# Patient Record
Sex: Male | Born: 1985 | Race: White | Hispanic: Yes | Marital: Single | Smoking: Never smoker
Health system: Southern US, Community
[De-identification: ages and names within clinical notes are randomized; demographics above are authoritative.]

## PROBLEM LIST (undated history)

## (undated) DIAGNOSIS — K5190 Ulcerative colitis, unspecified, without complications: Secondary | ICD-10-CM

---

## 2008-11-25 ENCOUNTER — Emergency Department (HOSPITAL_COMMUNITY): Admission: EM | Admit: 2008-11-25 | Discharge: 2008-11-25 | Payer: Self-pay | Admitting: Emergency Medicine

## 2009-01-05 ENCOUNTER — Emergency Department (HOSPITAL_COMMUNITY): Admission: EM | Admit: 2009-01-05 | Discharge: 2009-01-05 | Payer: Self-pay | Admitting: Emergency Medicine

## 2011-02-11 LAB — URINALYSIS, ROUTINE W REFLEX MICROSCOPIC
Bilirubin Urine: NEGATIVE
Nitrite: NEGATIVE
Protein, ur: NEGATIVE mg/dL
Specific Gravity, Urine: 1.016 (ref 1.005–1.030)
Urobilinogen, UA: 0.2 mg/dL (ref 0.0–1.0)

## 2011-02-15 LAB — D-DIMER, QUANTITATIVE (NOT AT ARMC): D-Dimer, Quant: <0.22 ug/mL-FEU (ref 0.00–0.48)

## 2013-12-05 ENCOUNTER — Emergency Department (HOSPITAL_COMMUNITY)
Admission: EM | Admit: 2013-12-05 | Discharge: 2013-12-05 | Disposition: A | Discharge status: Home / Self Care | Payer: Self-pay | Attending: Emergency Medicine | Admitting: Emergency Medicine

## 2013-12-05 ENCOUNTER — Encounter (HOSPITAL_COMMUNITY): Payer: Self-pay | Admitting: Emergency Medicine

## 2013-12-05 ENCOUNTER — Emergency Department (HOSPITAL_COMMUNITY): Payer: Self-pay

## 2013-12-05 DIAGNOSIS — R42 Dizziness and giddiness: Secondary | ICD-10-CM | POA: Insufficient documentation

## 2013-12-05 DIAGNOSIS — R51 Headache: Secondary | ICD-10-CM | POA: Insufficient documentation

## 2013-12-05 DIAGNOSIS — Z7982 Long term (current) use of aspirin: Secondary | ICD-10-CM | POA: Insufficient documentation

## 2013-12-05 DIAGNOSIS — R519 Headache, unspecified: Secondary | ICD-10-CM

## 2013-12-05 MED ORDER — SODIUM CHLORIDE 0.9 % IV BOLUS (SEPSIS)
1000.0000 mL | Freq: Once | INTRAVENOUS | Status: AC
  Administered 2013-12-05: 1000 mL via INTRAVENOUS

## 2013-12-05 MED ORDER — METOCLOPRAMIDE HCL 5 MG/ML IJ SOLN
10.0000 mg | Freq: Once | INTRAMUSCULAR | Status: AC
  Administered 2013-12-05: 10 mg via INTRAVENOUS
  Filled 2013-12-05: qty 2

## 2013-12-05 MED ORDER — DEXAMETHASONE SODIUM PHOSPHATE 10 MG/ML IJ SOLN
10.0000 mg | Freq: Once | INTRAMUSCULAR | Status: AC
  Administered 2013-12-05: 10 mg via INTRAVENOUS
  Filled 2013-12-05: qty 1

## 2013-12-05 MED ORDER — DIPHENHYDRAMINE HCL 50 MG/ML IJ SOLN
25.0000 mg | Freq: Once | INTRAMUSCULAR | Status: AC
  Administered 2013-12-05: 25 mg via INTRAVENOUS
  Filled 2013-12-05: qty 1

## 2013-12-05 NOTE — Progress Notes (Signed)
[facility] provided pt with a list of primary care resources and ACA information.  °

## 2013-12-05 NOTE — ED Notes (Signed)
Per pt, states he has had headache and dizziness for 2 days-took aspirin and it has not worked

## 2013-12-05 NOTE — Discharge Instructions (Signed)
Headaches, Frequently Asked Questions °MIGRAINE HEADACHES °Q: What is migraine? What causes it? How can I treat it? °A: Generally, migraine headaches begin as a dull ache. Then they develop into a constant, throbbing, and pulsating pain. You may experience pain at the temples. You may experience pain at the front or back of one or both sides of the head. The pain is usually accompanied by a combination of: °· Nausea. °· Vomiting. °· Sensitivity to light and noise. °Some people (about 15%) experience an aura (see below) before an attack. The cause of migraine is believed to be chemical reactions in the brain. Treatment for migraine may include over-the-counter or prescription medications. It may also include self-help techniques. These include relaxation training and biofeedback.  °Q: What is an aura? °A: About 15% of people with migraine get an "aura". This is a sign of neurological symptoms that occur before a migraine headache. You may see wavy or jagged lines, dots, or flashing lights. You might experience tunnel vision or blind spots in one or both eyes. The aura can include visual or auditory hallucinations (something imagined). It may include disruptions in smell (such as strange odors), taste or touch. Other symptoms include: °· Numbness. °· A "pins and needles" sensation. °· Difficulty in recalling or speaking the correct word. °These neurological events may last as long as 60 minutes. These symptoms will fade as the headache begins. °Q: What is a trigger? °A: Certain physical or environmental factors can lead to or "trigger" a migraine. These include: °· Foods. °· Hormonal changes. °· Weather. °· Stress. °It is important to remember that triggers are different for everyone. To help prevent migraine attacks, you need to figure out which triggers affect you. Keep a headache diary. This is a good way to track triggers. The diary will help you talk to your healthcare professional about your condition. °Q: Does  weather affect migraines? °A: Bright sunshine, hot, humid conditions, and drastic changes in barometric pressure may lead to, or "trigger," a migraine attack in some people. But studies have shown that weather does not act as a trigger for everyone with migraines. °Q: What is the link between migraine and hormones? °A: Hormones start and regulate many of your body's functions. Hormones keep your body in balance within a constantly changing environment. The levels of hormones in your body are unbalanced at times. Examples are during menstruation, pregnancy, or menopause. That can lead to a migraine attack. In fact, about three quarters of all women with migraine report that their attacks are related to the menstrual cycle.  °Q: Is there an increased risk of stroke for migraine sufferers? °A: The likelihood of a migraine attack causing a stroke is very remote. That is not to say that migraine sufferers cannot have a stroke associated with their migraines. In persons under age 40, the most common associated factor for stroke is migraine headache. But over the course of a person's normal life span, the occurrence of migraine headache may actually be associated with a reduced risk of dying from cerebrovascular disease due to stroke.  °Q: What are acute medications for migraine? °A: Acute medications are used to treat the pain of the headache after it has started. Examples over-the-counter medications, NSAIDs, ergots, and triptans.  °Q: What are the triptans? °A: Triptans are the newest class of abortive medications. They are specifically targeted to treat migraine. Triptans are vasoconstrictors. They moderate some chemical reactions in the brain. The triptans work on receptors in your brain. Triptans help   to restore the balance of a neurotransmitter called serotonin. Fluctuations in levels of serotonin are thought to be a main cause of migraine.  °Q: Are over-the-counter medications for migraine effective? °A:  Over-the-counter, or "OTC," medications may be effective in relieving mild to moderate pain and associated symptoms of migraine. But you should see your caregiver before beginning any treatment regimen for migraine.  °Q: What are preventive medications for migraine? °A: Preventive medications for migraine are sometimes referred to as "prophylactic" treatments. They are used to reduce the frequency, severity, and length of migraine attacks. Examples of preventive medications include antiepileptic medications, antidepressants, beta-blockers, calcium channel blockers, and NSAIDs (nonsteroidal anti-inflammatory drugs). °Q: Why are anticonvulsants used to treat migraine? °A: During the past few years, there has been an increased interest in antiepileptic drugs for the prevention of migraine. They are sometimes referred to as "anticonvulsants". Both epilepsy and migraine may be caused by similar reactions in the brain.  °Q: Why are antidepressants used to treat migraine? °A: Antidepressants are typically used to treat people with depression. They may reduce migraine frequency by regulating chemical levels, such as serotonin, in the brain.  °Q: What alternative therapies are used to treat migraine? °A: The term "alternative therapies" is often used to describe treatments considered outside the scope of conventional Western medicine. Examples of alternative therapy include acupuncture, acupressure, and yoga. Another common alternative treatment is herbal therapy. Some herbs are believed to relieve headache pain. Always discuss alternative therapies with your caregiver before proceeding. Some herbal products contain arsenic and other toxins. °TENSION HEADACHES °Q: What is a tension-type headache? What causes it? How can I treat it? °A: Tension-type headaches occur randomly. They are often the result of temporary stress, anxiety, fatigue, or anger. Symptoms include soreness in your temples, a tightening band-like sensation  around your head (a "vice-like" ache). Symptoms can also include a pulling feeling, pressure sensations, and contracting head and neck muscles. The headache begins in your forehead, temples, or the back of your head and neck. Treatment for tension-type headache may include over-the-counter or prescription medications. Treatment may also include self-help techniques such as relaxation training and biofeedback. °CLUSTER HEADACHES °Q: What is a cluster headache? What causes it? How can I treat it? °A: Cluster headache gets its name because the attacks come in groups. The pain arrives with little, if any, warning. It is usually on one side of the head. A tearing or bloodshot eye and a runny nose on the same side of the headache may also accompany the pain. Cluster headaches are believed to be caused by chemical reactions in the brain. They have been described as the most severe and intense of any headache type. Treatment for cluster headache includes prescription medication and oxygen. °SINUS HEADACHES °Q: What is a sinus headache? What causes it? How can I treat it? °A: When a cavity in the bones of the face and skull (a sinus) becomes inflamed, the inflammation will cause localized pain. This condition is usually the result of an allergic reaction, a tumor, or an infection. If your headache is caused by a sinus blockage, such as an infection, you will probably have a fever. An x-ray will confirm a sinus blockage. Your caregiver's treatment might include antibiotics for the infection, as well as antihistamines or decongestants.  °REBOUND HEADACHES °Q: What is a rebound headache? What causes it? How can I treat it? °A: A pattern of taking acute headache medications too often can lead to a condition known as "rebound headache."   A pattern of taking too much headache medication includes taking it more than 2 days per week or in excessive amounts. That means more than the label or a caregiver advises. With rebound  headaches, your medications not only stop relieving pain, they actually begin to cause headaches. Doctors treat rebound headache by tapering the medication that is being overused. Sometimes your caregiver will gradually substitute a different type of treatment or medication. Stopping may be a challenge. Regularly overusing a medication increases the potential for serious side effects. Consult a caregiver if you regularly use headache medications more than 2 days per week or more than the label advises. °ADDITIONAL QUESTIONS AND ANSWERS °Q: What is biofeedback? °A: Biofeedback is a self-help treatment. Biofeedback uses special equipment to monitor your body's involuntary physical responses. Biofeedback monitors: °· Breathing. °· Pulse. °· Heart rate. °· Temperature. °· Muscle tension. °· Brain activity. °Biofeedback helps you refine and perfect your relaxation exercises. You learn to control the physical responses that are related to stress. Once the technique has been mastered, you do not need the equipment any more. °Q: Are headaches hereditary? °A: Four out of five (80%) of people that suffer report a family history of migraine. Scientists are not sure if this is genetic or a family predisposition. Despite the uncertainty, a child has a 50% chance of having migraine if one parent suffers. The child has a 75% chance if both parents suffer.  °Q: Can children get headaches? °A: By the time they reach high school, most young people have experienced some type of headache. Many safe and effective approaches or medications can prevent a headache from occurring or stop it after it has begun.  °Q: What type of doctor should I see to diagnose and treat my headache? °A: Start with your primary caregiver. Discuss his or her experience and approach to headaches. Discuss methods of classification, diagnosis, and treatment. Your caregiver may decide to recommend you to a headache specialist, depending upon your symptoms or other  physical conditions. Having diabetes, allergies, etc., may require a more comprehensive and inclusive approach to your headache. The National Headache Foundation will provide, upon request, a list of NHF physician members in your state. °Document Released: 01/08/2004 Document Revised: 01/10/2012 Document Reviewed: 06/17/2008 °ExitCare® Patient Information ©2014 [employer]. ° ° °Emergency Department Resource Guide °1) Find a Doctor and Pay Out of Pocket °Although you won't have to find out who is covered by your insurance plan, it is a good idea to ask around and get recommendations. You will then need to call the office and see if the doctor you have chosen will accept you as a new patient and what types of options they offer for patients who are self-pay. Some doctors offer discounts or will set up payment plans for their patients who do not have insurance, but you will need to ask so you aren't surprised when you get to your appointment. ° °2) Contact Your Local Health Department °Not all health departments have doctors that can see patients for sick visits, but many do, so it is worth a call to see if yours does. If you don't know where your local health department is, you can check in your phone book. The CDC also has a tool to help you locate your state's health department, and many state websites also have listings of all of their local health departments. ° °3) Find a Walk-in Clinic °If your illness is not likely to be very severe or complicated, you may   want to try a walk in clinic. These are popping up all over the country in pharmacies, drugstores, and shopping centers. They're usually staffed by nurse practitioners or physician assistants that have been trained to treat common illnesses and complaints. They're usually fairly quick and inexpensive. However, if you have serious medical issues or chronic medical problems, these are probably not your best option. ° °No Primary Care Doctor: °- Call Health  Connect at  832-8000 - they can help you locate a primary care doctor that  accepts your insurance, provides certain services, etc. °- Physician Referral Service- 1-800-533-3463 ° °Chronic Pain Problems: °Organization         Address  Phone   Notes  °[facility]  (336) 297-2271 Patients need to be referred by their primary care doctor.  ° °Medication Assistance: °Organization         Address  Phone   Notes  °[place] [facility] [address] °[place], [place] [address] (336) 641-8030 --Must be a resident of [place] °-- Must have NO insurance coverage whatsoever (no Medicaid/ Medicare, etc.) °-- The pt. MUST have a primary care doctor that directs their care regularly and follows them in the community °  °[facility]  (866) 331-1348   °[employer]  (888) 892-1162   ° °Agencies that provide inexpensive medical care: °Organization         Address  Phone   Notes  °[facility]  (336) 832-8035   °[facility]    (336) 832-7272   °[facility] [address] °[place], [place] [address] (336) 832-4777   °[facility] [address], °[place] (336) 271-4999   °[facility]    (336) 373-0678   °[facility]    (336) 272-1050   °[facility] ° [address], [place] Phone:  (336) 832-4444, Fax:  (336) 832-4440 Hours of Operation:  9 am - 6 pm, M-F.  Also accepts Medicaid/Medicare and self-pay.  °[facility] ° [address], [place] Phone: (336) [phone], Fax: (336) [phone]. Hours of Operation:  8:30 am - 5:30 pm, M-F.  Also accepts Medicaid and self-pay.  °[facility] [place] [address], [place] Phone: (336) 878-6027   °[facility] [address], [place],  (336)723-1848, Ext. 123 Mondays & Thursdays: 7-9 AM.  First 15 patients are seen on a first come, first serve basis. °   ° °Medicaid-accepting [place] Providers: ° °Organization         Address  Phone   Notes  °[facility] [address], [place] (336) 641-2100 Also accepts self-pay patients.  °[facility] [address], [place] ° (336) 856-9996   °[facility] [address], [place] (336) 288-8857   °[facility] [address] [place] [address], [place] (336) 299-7000   °Veita Bland [address], [place]  ° (336) 373-1557 Only accepts [place] Access Medicaid patients after they have their name applied to their card.  ° °Self-Pay (no insurance) in [place]: ° °Organization         Address  Phone   Notes  °Sickle Cell Patients, [facility] [address], [place] (336) 832-1970   °[facility] [address], [place] (336)   832-4400   °[facility] ° [address], [place] (336) 992-4800   °[facility]/Dr. Osei-Bonsu ° [address] [place] [address], [place] or [address], [place] (336) 841-8500 Phone number for both [place] and [place] locations is the same.  °[facility] [address], [place] (336) 299-0000   °[facility] [address] [place] [address], [place] or [address] (336) 852-7530 °(336) 878-2260   °[facility] [address], [place] (336) 350-1642, phone; (336) 294-5005, fax Sees patients 1st and 3rd Saturday of every month.  Must not qualify for public or private insurance (i.e. Medicaid, Medicare, [place] Health Choice, Veterans' Benefits) • Household income should be no more than 200% of the poverty level •The clinic cannot treat you if you are pregnant or think you are pregnant • Sexually transmitted diseases are not treated at the clinic.  ° ° °Dental Care: °Organization         Address  Phone  Notes  °[place]  [facility] [address], [place] (336) 641-6152 Accepts children up to age 21 who are enrolled in Medicaid or [place] Health Choice; pregnant women with a Medicaid card; and children who have applied for Medicaid or [place] Health Choice, but were declined, whose parents can pay a reduced fee at time of service.  °[place] [place]  [address], [place] (336) 641-7733 Accepts children up to age 21 who are enrolled in Medicaid or [place] Health Choice; pregnant women with a Medicaid card; and children who have applied for Medicaid or [place] Health Choice, but were declined, whose parents can pay a reduced fee at time of service.  °[facility] ° [address], [place] (336) 641-4533 Patients are seen by appointment only. Walk-ins are not accepted. [facility] will see patients 18 years of age and older. °Monday - Tuesday (8am-5pm) °Most Wednesdays (8:30-5pm) °$30 per visit, cash only  °[facility] ° [address], [place] (336) 641-4533 Patients are seen by appointment only. Walk-ins are not accepted. [facility] will see patients 18 years of age and older. °One Wednesday Evening (Monthly: Volunteer Based).  $30 per visit, cash only  °[facility]  (919) 537-3737 for adults; Children under age 4, call Graduate Pediatric Dentistry at ([phone]) [phone]. Children aged 4-14, please call ([phone]) [phone] to request a pediatric application. ° Dental services are provided in all areas of dental care including fillings, crowns and bridges, complete and partial dentures, implants, gum treatment, root canals, and extractions. Preventive care is also provided. Treatment is provided to both adults and children. °Patients are selected via a lottery and there is often a waiting list. °  °[facility] [address], °[place] ° (336) 763-8833  [email or web address] °  °[facility] [address], [place], [place] (336)723-1848, Ext. 123 Second and Fourth Thursday of each month, opens at 6:30 AM; Clinic ends at 9 AM.  Patients are seen on a first-come first-served basis, and a limited number are seen during each clinic.  ° °[facility] ° [address], [place], [place] (336) 723-7904   Eligibility Requirements °You must have lived in [place], [place], or [place] counties for at least the last three months. °  You cannot be eligible for state   or federal sponsored healthcare insurance, including Veterans Administration, Medicaid, or Medicare. °  You generally cannot be eligible for healthcare insurance through your employer.  °  How to apply: °Eligibility screenings are held every Tuesday and Wednesday afternoon from 1:00 pm until 4:00 pm. You do not need an appointment for the interview!  °[facility] [address], [place], [place] 336-631-2330   °[facility]  336-342-8273   °[facility]  336-703-3100   °[facility]  336-570-6415   ° °Behavioral Health Resources in the Community: °Intensive Outpatient Programs °Organization         Address  Phone  Notes  °[facility] [address], [place], [place] 336-878-6098   °[facility] [address], [place], [place] 336-832-9800   °[facility] [address], [place], [place] ° 336-882-2125   °[facility] [address],  °[place], [place] 1-800-853-5163 or 336-641-4981   °Substance Abuse Resources °Organization         Address  Phone  Notes  °[facility]  336-882-2125   °[facility]  336-784-9470   °[facility]  336-285-9073   °[facility]  336-845-3988   °Residential & Outpatient Substance Abuse Program  1-800-659-3381   °Psychological Services °Organization          Address  Phone  Notes  °[facility]  336- 832-9600   °[facility]  336- 378-7881   °[facility] [address], [place] 1-800-853-5163 or 336-641-4981   ° °Mobile Crisis Teams °Organization         Address  Phone  Notes  °[facility], Mobile Crisis Care Unit  1-877-626-1772   °Assertive °[facility] ° [address] [place], [place] 336-834-9664   °Sharon DeEsch [address] °[place] 336-554-5454   ° °Self-Help/Support Groups °Organization         Address  Phone             Notes  °[facility] - variety of support groups  336- 373-1402 Call for more information  °Narcotics Anonymous (NA), [facility] [address], °[place]  2 meetings at this location  ° °Residential Treatment Programs °Organization         Address  Phone  Notes  °[facility] [address],    °[place]  1-866-801-8205   °[facility] ° [address], [place], [place] 704-293-8524   °[facility] [address], [place] 336-845-3988 Admissions: 8am-3pm M-F  °[facility] [address]    °[place], [place] 336-841-1104   °[facility] [address], [place], [place] 336-379-7146   °[facility] [address]  °[place], [place] 336-285-9073   °[facility] - Intensive Outpatient [address], [place], [place] 336-852-3033   °[facility] [address]  °[place], [place] 1-877-615-2722 or 336-784-9470   °[facility] [address] [place], [place] 336-227-7417 Accepts Medicaid  °[facility] [address]  °[place] 1-800-659-3381 Substance Abuse/Addiction Treatment  ° °[place] Behavioral Health Resources °Organization         Address  Phone  Notes  °[facility]  (888) 581-9988   °Julie Brannon, PhD [address] [place],    (  336) 349-5553 or (336) 951-0000   °[facility]   [address] °[place], [place] (336) 349-4454   °[facility] [address], [place], [place] (336) 342-8316 Insurance/Medicaid/sponsorship through [facility]  °[facility] [address]                                    [place], [place] (336) 342-8316 Therapy/tele-psych/case  °[facility] [address]  ° [place], [place] (336) 349-2233    °Dr. Arfeen  (336) 349-4544   °[facility] [place]  [employer] [place] [facility] 1) [address], [place] °2) [address], [place] °3)  [address], [place] (336) 349-3220 °(336) 342-7768 ° °(336) 342-8140   °[place] [facility] (336) 342-1394 or (336) 342-3537 (After Hours)    ° ° ° °

## 2013-12-05 NOTE — ED Notes (Signed)
Rodney Gross present today with a chief complaint of dizziness without syncope and headache since yesterday.  Patient reports he took aspirin this morning which has decreased the headache; patient reports pain to be bilaterally on top of head. Patient also states that he's been feeling dizzy at rest and upon ambulation. Patient ambulatory in department, alert and orientedx4, no distress noted.

## 2013-12-05 NOTE — ED Provider Notes (Signed)
CSN: [id number]     Arrival date & time 12/05/13  1217 History   First MD Initiated Contact with Patient 12/05/13 1315     Chief Complaint  Patient presents with  . Headache  . Dizziness   (Consider location/radiation/quality/duration/timing/severity/associated sxs/prior Treatment) Patient is a 28 y.o. male presenting with headaches.  Headache Pain location:  L temporal and R temporal Quality:  Dull (throbbing) Radiates to:  Does not radiate Pain severity now: severe. Onset quality:  Sudden (over course of about 20 minutes.) Duration:  24 hours Timing:  Constant Progression:  Unchanged Chronicity:  New Context: not activity, not exposure to bright light and not loud noise   Relieved by:  Nothing Exacerbated by: not worse with bright lights or loud noises. Ineffective treatments: aspirin. Associated symptoms: dizziness   Associated symptoms: no abdominal pain, no blurred vision, no congestion, no cough, no diarrhea, no fever, no nausea, no numbness, no photophobia, no vomiting and no weakness     History reviewed. No pertinent past medical history. History reviewed. No pertinent past surgical history. No family history on file. History  Substance Use Topics  . Smoking status: Never Smoker   . Smokeless tobacco: Not on file  . Alcohol Use: No    Review of Systems  Constitutional: Negative for fever.  HENT: Negative for congestion.   Eyes: Negative for blurred vision and photophobia.  Respiratory: Negative for cough and shortness of breath.   Cardiovascular: Negative for chest pain.  Gastrointestinal: Negative for nausea, vomiting, abdominal pain and diarrhea.  Neurological: Positive for dizziness and headaches. Negative for numbness.  All other systems reviewed and are negative.    Allergies  Review of patient's allergies indicates no known allergies.  Home Medications   Current Outpatient Rx  Name  Route  Sig  Dispense  Refill  . aspirin 325 MG tablet   Oral  Take 325 mg by mouth daily.          BP 119/71  Pulse 78  Temp(Src) 98.2 F (36.8 C) (Oral)  Resp 16  SpO2 99% Physical Exam  Nursing note and vitals reviewed. Constitutional: He is oriented to person, place, and time. He appears well-developed and well-nourished. No distress.  HENT:  Head: Normocephalic and atraumatic.  Mouth/Throat: Oropharynx is clear and moist.  Eyes: Conjunctivae are normal. Pupils are equal, round, and reactive to light. No scleral icterus.  Neck: Neck supple.  Cardiovascular: Normal rate, regular rhythm, normal heart sounds and intact distal pulses.   No murmur heard. Pulmonary/Chest: Effort normal and breath sounds normal. No stridor. No respiratory distress. He has no wheezes. He has no rales.  Abdominal: Soft. He exhibits no distension. There is no tenderness.  Musculoskeletal: Normal range of motion. He exhibits no edema.  Neurological: He is alert and oriented to person, place, and time. He has normal strength. No cranial nerve deficit or sensory deficit. Coordination and gait normal. GCS eye subscore is 4. GCS verbal subscore is 5. GCS motor subscore is 6.  Skin: Skin is warm and dry. No rash noted.  Psychiatric: He has a normal mood and affect. His behavior is normal.    ED Course  Procedures (including critical care time) Labs Review Labs Reviewed - No data to display Imaging Review Ct Head Wo Contrast  12/05/2013   CLINICAL DATA:  Dizziness and blurred vision ; headache  EXAM: CT HEAD WITHOUT CONTRAST  TECHNIQUE: Contiguous axial images were obtained from the base of the skull through the vertex without intravenous  contrast. Study was obtained within 24 hr of patient's arrival at the emergency department.  COMPARISON:  None.  FINDINGS: The ventricles are normal in size and configuration. There is no mass, hemorrhage, extra-axial fluid collection, or midline shift. Gray-white compartments are normal. There is no demonstrable acute infarct. Bony  calvarium appears intact. The mastoid air cells are clear.  IMPRESSION: Study within normal limits.   Electronically Signed   By: Bretta Bang M.D.   On: 12/05/2013 14:15  All radiology studies independently viewed by me.     EKG Interpretation   None       MDM   1. Headache    29 yo male with relatively sudden onset headache since yesterday.  Normal neurologic exam, no distress, afebrile, no nuchal rigidity.  Head CT obtained and negative.  Headache treated with IV metoclopramide, diphenhydramine, dexamethasone, and fluids.  We discussed the utility of an LP to further assess for potentially life threatening causes of his headache, but patient declined this test.  His headache near resolved and he was subsequently discharged.      Candyce Churn III, MD 12/05/13 [phone]

## 2014-07-04 ENCOUNTER — Inpatient Hospital Stay: Payer: Self-pay | Admitting: Internal Medicine

## 2014-07-04 LAB — COMPREHENSIVE METABOLIC PANEL
ALT: 50 U/L
ANION GAP: 9 (ref 7–16)
Albumin: 3.6 g/dL (ref 3.4–5.0)
Alkaline Phosphatase: 73 U/L
BILIRUBIN TOTAL: 0.5 mg/dL (ref 0.2–1.0)
BUN: 12 mg/dL (ref 7–18)
CREATININE: 1.19 mg/dL (ref 0.60–1.30)
Calcium, Total: 9 mg/dL (ref 8.5–10.1)
Chloride: 106 mmol/L (ref 98–107)
Co2: 27 mmol/L (ref 21–32)
EGFR (Non-African Amer.): >60
GLUCOSE: 100 mg/dL — AB (ref 65–99)
OSMOLALITY: 283 (ref 275–301)
Potassium: 3.9 mmol/L (ref 3.5–5.1)
SGOT(AST): 32 U/L (ref 15–37)
Sodium: 142 mmol/L (ref 136–145)
Total Protein: 8.1 g/dL (ref 6.4–8.2)

## 2014-07-04 LAB — CBC WITH DIFFERENTIAL/PLATELET
BASOS PCT: 0.9 %
Basophil #: 0.1 10*3/uL (ref 0.0–0.1)
EOS ABS: 0.3 10*3/uL (ref 0.0–0.7)
Eosinophil %: 3.2 %
HCT: 42.7 % (ref 40.0–52.0)
HGB: 14.2 g/dL (ref 13.0–18.0)
LYMPHS ABS: 1.9 10*3/uL (ref 1.0–3.6)
LYMPHS PCT: 22.5 %
MCH: 27.6 pg (ref 26.0–34.0)
MCHC: 33.3 g/dL (ref 32.0–36.0)
MCV: 83 fL (ref 80–100)
MONOS PCT: 8.1 %
Monocyte #: 0.7 x10 3/mm (ref 0.2–1.0)
NEUTROS PCT: 65.3 %
Neutrophil #: 5.4 10*3/uL (ref 1.4–6.5)
PLATELETS: 300 10*3/uL (ref 150–440)
RBC: 5.16 10*6/uL (ref 4.40–5.90)
RDW: 12.9 % (ref 11.5–14.5)
WBC: 8.3 10*3/uL (ref 3.8–10.6)

## 2014-07-04 LAB — URINALYSIS, COMPLETE
BLOOD: NEGATIVE
Bilirubin,UR: NEGATIVE
Glucose,UR: NEGATIVE mg/dL (ref 0–75)
KETONE: NEGATIVE
NITRITE: NEGATIVE
Ph: 5 (ref 4.5–8.0)
Protein: NEGATIVE
RBC, UR: NONE SEEN /HPF (ref 0–5)
SPECIFIC GRAVITY: 1.015 (ref 1.003–1.030)
SQUAMOUS EPITHELIAL: NONE SEEN

## 2014-07-04 LAB — TROPONIN I: Troponin-I: <0.02 ng/mL

## 2014-07-04 LAB — LIPASE, BLOOD: LIPASE: 2825 U/L — AB (ref 73–393)

## 2014-07-04 LAB — RAPID HIV SCREEN (HIV 1/2 AB+AG)

## 2014-07-05 LAB — CBC WITH DIFFERENTIAL/PLATELET
Basophil #: 0 x10 3/mm 3 (ref 0.0–0.1)
Basophil %: 0.6 %
Eosinophil #: 0.3 x10 3/mm 3 (ref 0.0–0.7)
Eosinophil %: 3.9 %
HCT: 38.1 % — ABNORMAL LOW (ref 40.0–52.0)
HGB: 12.5 g/dL — ABNORMAL LOW (ref 13.0–18.0)
Lymphocyte %: 33.4 %
Lymphs Abs: 2.7 x10 3/mm 3 (ref 1.0–3.6)
MCH: 27.5 pg (ref 26.0–34.0)
MCHC: 32.8 g/dL (ref 32.0–36.0)
MCV: 84 fL (ref 80–100)
Monocyte #: 0.6 x10 3/mm (ref 0.2–1.0)
Monocyte %: 7.8 %
Neutrophil #: 4.4 x10 3/mm 3 (ref 1.4–6.5)
Neutrophil %: 54.3 %
Platelet: 241 x10 3/mm 3 (ref 150–440)
RBC: 4.54 x10 6/mm 3 (ref 4.40–5.90)
RDW: 13.1 % (ref 11.5–14.5)
WBC: 8.2 x10 3/mm 3 (ref 3.8–10.6)

## 2014-07-05 LAB — BASIC METABOLIC PANEL WITH GFR
Anion Gap: 7 (ref 7–16)
BUN: 9 mg/dL (ref 7–18)
Calcium, Total: 8.6 mg/dL (ref 8.5–10.1)
Chloride: 106 mmol/L (ref 98–107)
Co2: 28 mmol/L (ref 21–32)
Creatinine: 1.11 mg/dL (ref 0.60–1.30)
EGFR (African American): >60
EGFR (Non-African Amer.): >60
Glucose: 78 mg/dL (ref 65–99)
Osmolality: 279 (ref 275–301)
Potassium: 3.7 mmol/L (ref 3.5–5.1)
Sodium: 141 mmol/L (ref 136–145)

## 2014-07-05 LAB — LIPID PANEL
Cholesterol: 115 mg/dL (ref 0–200)
HDL Cholesterol: 24 mg/dL — ABNORMAL LOW (ref 40–60)
Ldl Cholesterol, Calc: 72 mg/dL (ref 0–100)
Triglycerides: 97 mg/dL (ref 0–200)
VLDL Cholesterol, Calc: 19 mg/dL (ref 5–40)

## 2014-07-05 LAB — LIPASE, BLOOD: Lipase: 1612 U/L — ABNORMAL HIGH (ref 73–393)

## 2014-07-06 LAB — URINE CULTURE

## 2014-07-20 ENCOUNTER — Emergency Department: Payer: Self-pay

## 2014-07-20 LAB — CBC WITH DIFFERENTIAL/PLATELET
BASOS ABS: 0 10*3/uL (ref 0.0–0.1)
BASOS PCT: 0.6 %
Eosinophil #: 0.2 10*3/uL (ref 0.0–0.7)
Eosinophil %: 2.6 %
HCT: 43.3 % (ref 40.0–52.0)
HGB: 14.7 g/dL (ref 13.0–18.0)
LYMPHS ABS: 2 10*3/uL (ref 1.0–3.6)
Lymphocyte %: 25.1 %
MCH: 27.9 pg (ref 26.0–34.0)
MCHC: 34 g/dL (ref 32.0–36.0)
MCV: 82 fL (ref 80–100)
MONO ABS: 0.5 x10 3/mm (ref 0.2–1.0)
Monocyte %: 6.5 %
NEUTROS PCT: 65.2 %
Neutrophil #: 5.3 10*3/uL (ref 1.4–6.5)
PLATELETS: 315 10*3/uL (ref 150–440)
RBC: 5.28 10*6/uL (ref 4.40–5.90)
RDW: 13.3 % (ref 11.5–14.5)
WBC: 8.1 10*3/uL (ref 3.8–10.6)

## 2014-07-20 LAB — URINALYSIS, COMPLETE
Bacteria: NONE SEEN
Bilirubin,UR: NEGATIVE
Glucose,UR: NEGATIVE mg/dL (ref 0–75)
Ketone: NEGATIVE
NITRITE: NEGATIVE
PH: 5 (ref 4.5–8.0)
Protein: NEGATIVE
SPECIFIC GRAVITY: 1.017 (ref 1.003–1.030)
Squamous Epithelial: NONE SEEN

## 2014-07-20 LAB — COMPREHENSIVE METABOLIC PANEL
ALBUMIN: 4.1 g/dL (ref 3.4–5.0)
ALT: 78 U/L — AB
ANION GAP: 7 (ref 7–16)
Alkaline Phosphatase: 79 U/L
BUN: 14 mg/dL (ref 7–18)
Bilirubin,Total: 0.6 mg/dL (ref 0.2–1.0)
CALCIUM: 9.5 mg/dL (ref 8.5–10.1)
CREATININE: 1.34 mg/dL — AB (ref 0.60–1.30)
Chloride: 103 mmol/L (ref 98–107)
Co2: 28 mmol/L (ref 21–32)
EGFR (African American): >60
EGFR (Non-African Amer.): >60
GLUCOSE: 96 mg/dL (ref 65–99)
Osmolality: 276 (ref 275–301)
POTASSIUM: 4.3 mmol/L (ref 3.5–5.1)
SGOT(AST): 28 U/L (ref 15–37)
Sodium: 138 mmol/L (ref 136–145)
Total Protein: 8.5 g/dL — ABNORMAL HIGH (ref 6.4–8.2)

## 2014-07-20 LAB — GC/CHLAMYDIA PROBE AMP

## 2014-07-20 LAB — LIPASE, BLOOD: LIPASE: 671 U/L — AB (ref 73–393)

## 2014-07-20 LAB — TROPONIN I

## 2015-02-22 NOTE — Discharge Summary (Signed)
PATIENT NAME:  Rodney Gross, Rodney Gross MR#:  [id number] DATE OF BIRTH:  1986-03-04  DATE OF ADMISSION:  07/04/2014  DATE OF DISCHARGE:  07/05/2014  DISCHARGE DIAGNOSES:  1.  Acute pancreatitis of unknown etiology.  2.  Urinary tract infection.   DISCHARGE MEDICATIONS:  1.  Acetaminophen oxycodone 325/10 1 tablet oral 4 times a day as needed for pain.  2.  Ciprofloxacin 5 mg oral 2 times a day for 5 days.   DISCHARGE INSTRUCTIONS: Regular diet. Activity as tolerated. Follow up with primary physician in 1 to 2 weeks.   ADMITTING HISTORY PHYSICAL: Please see detailed H and P dictated by Dr. Allena KatzPatel. In brief, a 29 year old male patient presented to the hospital complaining of epigastric pain. He was found to have elevated lipase and was admitted to the hospitalist service for acute pancreatitis.   HOSPITAL COURSE:   1.  Acute pancreatitis.  The patient had a CT scan of the abdomen done which showed distal pancreatic body and tail involved with the acute pancreatitis.  No pseudocyst.  No significant fluid.  Liver and gallbladder were normal.  No gallstones, no cholecystitis found.  The patient was treated symptomatically, initially n.p.o. later advanced in diet.  By the time of discharge, the patient had some mild pain with eating, but he feels significantly better and has requested to be discharged home and he will be discharged home on some pain medications.  The patient was also treated for a UTI in the hospital and will continue ciprofloxacin for 4 more days.  2.  Prior to discharge, the patient's examination showed mild epigastric tenderness. Lungs sound clear. S1, S2 heard without any murmurs.  3.  The patient was also seen by Dr. Mechele CollinElliott during the hospital stay.   TIME SPENT ON DAY OF DISCHARGE IN DISCHARGE ACTIVITY:  40 minutes     ____________________________ Molinda BailiffSrikar R. Haji Delaine, MD srs:DT D: 07/09/2014 12:04:36 ET T: 07/09/2014 12:52:16 ET JOB#: [id number]  cc: Wardell HeathSrikar R. Andreal Vultaggio, MD,  <Dictator> Orie FishermanSRIKAR R Jakayden Cancio MD ELECTRONICALLY SIGNED 07/20/2014 14:29

## 2015-02-22 NOTE — H&P (Signed)
PATIENT NAME:  Rodney Gross, Rodney Gross MR#:  [id number] DATE OF BIRTH:  1986/02/14  DATE OF ADMISSION:  07/04/2014  PRIMARY CARE PROVIDER: None.   EMERGENCY DEPARTMENT REFERRING PHYSICIAN: Dr. Lequita HaltSchavitts   CHIEF COMPLAINT: Epigastric pain.   HISTORY OF PRESENT ILLNESS: The patient is a 29 year old Spanish male with history of hemorrhagic E. coli infection in the past who presents with abdominal pain, ongoing for about a week and a half. He describes the pain as a sharp epigastric pain with radiation of the pain to the left lower quadrant. He states that whenever he ate his pain got worse. He did not have any nausea, vomiting or diarrhea. He also has sweats at nighttime. The patient also was having a whitish drainage from his penis, ongoing for the past few weeks. He denies any burning or hesitancy. Denies any chest pains or shortness of breath.   PAST MEDICAL HISTORY: Hemorrhagic E. coli infection in the past.   ALLERGIES: None.   PAST SURGICAL HISTORY: None.   MEDICATIONS: None.   SOCIAL HISTORY: Does not smoke. Does not drink; used to drink 5 years ago. Sexually active with 1 person.   FAMILY HISTORY: Positive for hypertension.   REVIEW OF SYSTEMS: CONSTITUTIONAL: Denies any fevers, fatigue, weakness. No weight loss. No weight gain.  EYES: No blurred or double vision. No redness. No inflammation. No glaucoma. No cataracts.  ENT: No tinnitus. No ear pain. No hearing loss. No seasonal or year-round allergies. No difficulty swallowing.  RESPIRATORY: Denies any cough, wheezing, hemoptysis. No dyspnea. No COPD. CARDIOVASCULAR: Denies any chest pain, orthopnea, edema, or dyspnea on exertion.  GASTROINTESTINAL: Complains of abdominal pain, but no nausea, vomiting or diarrhea. No GERD. No IBS. No jaundice. No rectal bleeding.  GENITOURINARY: Denies any dysuria, hematuria, renal calculus. Complains of whitish drainage. ENDOCRINE: Denies any polyuria, nocturia or thyroid problems.  HEMATOLOGIC AND  LYMPHATIC: Denies anemia, easy bruisability or bleeding.  SKIN: No acne. No rash.  MUSCULOSKELETAL: Denies any pain in the neck, back or shoulder.  NEUROLOGIC: No numbness, CVA, TIA or seizures.  PSYCHIATRIC: Denies any anxiety, insomnia, or ADD.   PHYSICAL EXAMINATION: VITAL SIGNS: Temperature 99, pulse 97, respirations 18, blood pressure 138/85, O2 98%.  GENERAL: The patient is a well-developed, well-nourished male in no acute distress.  HEENT: Head atraumatic, normocephalic. Pupils equally round and reactive to light and accommodation. There is no conjunctival pallor. No scleral icterus. Nasal exam shows no drainage or ulceration. Oropharynx is clear without any exudate.  NECK: Supple without any JVD.  HEART: Regular rate and rhythm. No murmurs, rubs, clicks, or gallops. PMI is not displaced.  LUNGS: Clear to auscultation bilaterally without any rales, rhonchi or wheezing.  ABDOMEN: Soft. There is no tenderness currently. There is no rebound. No guarding. Positive bowel sounds x4.  EXTREMITIES: No clubbing, cyanosis, or edema.  SKIN: No rash.  LYMPHATICS: No lymph nodes palpable.  VASCULAR: Good DP and PT pulses.  PSYCHIATRIC: Not anxious or depressed.  NEUROLOGIC: Awake, alert and oriented x3. No focal deficits.  DIAGNOSTIC DATA: Glucose 100, BUN 12, creatinine 1.19, sodium 142, potassium 3.9, chloride 106, CO2 27. Lipase 2825. LFTs were normal. Troponin less than 0.02. WBC 8.3, hemoglobin 14.2, platelet count 300,000.   Urinalysis: 3+ leukocytes, WBCs 131.   ASSESSMENT AND PLAN: The patient is a 29 year old Spanish male with no medical history, has had abdominal pain for the past 10 days. 1.  Abdominal pain, likely due to acute pancreatitis, etiology unclear. The patient does not drink. He is  not on any medications at this time. We will keep him n.p.o. and get a CT of the abdomen and pelvis. I will have gastroenterology evaluate the patient for further recommendations. We will give him  aggressive IV fluids. 2.  Penile discharge, possibly urinary tract infection. We will check his urine for Chlamydia and gonorrhea. He will be on IV ceftriaxone. We will get urine cultures. We will also check HIV.  3.  Miscellaneous. He will be on Lovenox for deep vein thrombosis prophylaxis.   TIME SPENT: 50 minutes.  ____________________________ Rodney Scotts Allena Katz, MD shp:sb D: 07/04/2014 15:16:57 ET T: 07/04/2014 16:19:23 ET JOB#: [id number]  cc: Rodney Gross H. Allena Katz, MD, <Dictator> Charise Carwin MD ELECTRONICALLY SIGNED 07/07/2014 11:46

## 2015-02-22 NOTE — Consult Note (Signed)
PATIENT NAME:  Rodney Gross, Rodney Gross MR#:  [id number] DATE OF BIRTH:  [birth year]  DATE OF CONSULTATION:  07/04/2014  REFERRING PHYSICIAN:   CONSULTING PHYSICIAN:  Manya Silvas, MD  HISTORY OF PRESENT ILLNESS: The patient is a 29 year old Hispanic male who presented with onset 10 days ago of consistent epigastric pain. Pain would sometimes go to the left side, sometimes a little bit left lower, pain would also go into his back. Pain would increase a few minutes after eating. He denies any vomiting with this pain. Came to the Emergency Room for the pain and was noted to have a very elevated lipase and was admitted to the hospital with diagnosis of pancreatitis. I was asked to see him in consultation.    Two months ago, he had some similar pain in the epigastric area that went away in a few days.   PAST MEDICAL HISTORY: Hemorrhagic Escherichia coli infection in June 2014   ALLERGIES: None.   PAST SURGICAL HISTORY: None.   MEDICATIONS: He was taking ibuprofen 800 mg a day at once for about a week or so because of the pain.   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 He quit at age 33, also would drink beer and Old English liquor.   HABITS: Does not smoke.   REVIEW OF SYSTEMS: He denies fever. No vomiting. His stools have been somewhat dark brown and increased mucus. He has had severe spells of pain lasting 45 to 60 minutes with pain climbing to a level of 10, normally would be a 6 or 7.   The patient also took a bottle of prohormone tablets that are supposed to mimic testosterone. This was in April of this year.   FAMILY HISTORY: Uncle died of a brain cancer; an aunt with colon cancer is very ill and near death and she is in her 25s.   PHYSICAL EXAMINATION:  GENERAL: Hispanic male in no acute distress lying in bed. VITAL SIGNS: Temperature 98.7, pulse 98, respirations 20, blood pressure 127/76,  oxygen saturation 99% on room air.  HEENT: Sclerae anicteric. Conjunctivae negative. Tongue negative.  HEAD: Atraumatic.  CHEST: Clear.  HEART: Shows no murmurs or gallops I can hear.  ABDOMEN: Shows flat, nondistended. Bowel sounds are present. No hepatosplenomegaly. No masses. No bruits. There is some epigastric tenderness. Tenderness in the left upper quadrant. No tenderness in the lower abdominal area.    RADIOLOGICAL DATA: A urinalysis shows 3+ leukocyte esterase. HIV is nonreacting. White blood count 8.3, hemoglobin 14.2, hematocrit 42.7, platelet count 300,000. Troponin is negative. Liver panel is normal. A MET-B is normal, except for glucose of 100 and lipase of 2825. The patient had a CAT scan of the abdomen with report of enlargement of and edema of the pancreatic tail and distal body, closely applied to the posterior aspect of the stomach and the splenic hilum. The pancreatic head and body appear normal. No parenchymal or ductal calcifications are demonstrated. The liver, gallbladder and spleen are unremarkable. 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 The reading was consistent with acute pancreatitis with peripancreatic inflammatory change.   ASSESSMENT: Acute pancreatitis, etiology uncertain. This could be chronic pancreatitis in a person who was a binge drinker for 10 years. I will check an IgG4 level.   I explained to the patient that there is no specific treatment for the pancreatitis except treating symptoms and providing IV  fluids and avoiding alcohol. He took ibuprofen at least once a week. I doubt this produced any problems with the pancreas. Given his pancreatitis is only a portion of the pancreas, this makes me wonder a little bit about the possibility of pancreas divisum, this was not mentioned on the CAT scan report. Will follow with you.    ____________________________ Manya Silvas, MD rte:TT D: 07/04/2014 18:50:11 ET T: 07/04/2014 19:07:22  ET JOB#: [id number]  cc: Manya Silvas, MD, <Dictator> Shreyang H. Posey Pronto, MD Manya Silvas MD ELECTRONICALLY SIGNED 07/07/2014 14:30

## 2015-02-22 NOTE — Consult Note (Signed)
Pt with pancreatitis, for several days by history.  No involving body and tail but not head of pancreas (on CT scan) Will check IgG4 level, agree with plans for hydration and pain control.  Electronic Signatures: Scot JunElliott, Gabriel Conry T (MD)  (Signed on 03-Sep-15 18:51)  Authored  Last Updated: 03-Sep-15 18:51 by Scot JunElliott, Devlin Brink T (MD)

## 2016-09-01 IMAGING — CT CT ABD-PELV W/ CM
2 of 4 series · 15 of 46 positions shown, 17 images · IV contrast (isovue)
Comparison: Acute abdominal series of January 05, 2009.

CLINICAL DATA: Epigastric and left upper quadrant pain for several
days made worse by eating or drinking

EXAM:
CT ABDOMEN AND PELVIS WITH CONTRAST
TECHNIQUE: Multidetector CT imaging of the abdomen and pelvis was performed
using the standard protocol following bolus administration of
intravenous contrast.
CONTRAST:  100 cc of Isovue 370 intravenously. The patient also
received oral contrast material.

[Series 2: routine abd pel with · axial · 0.75mm/px · z∈[-1128,-662]mm · 12 of 103 slices shown, 14 images]
[im 5/103  soft-tissue]
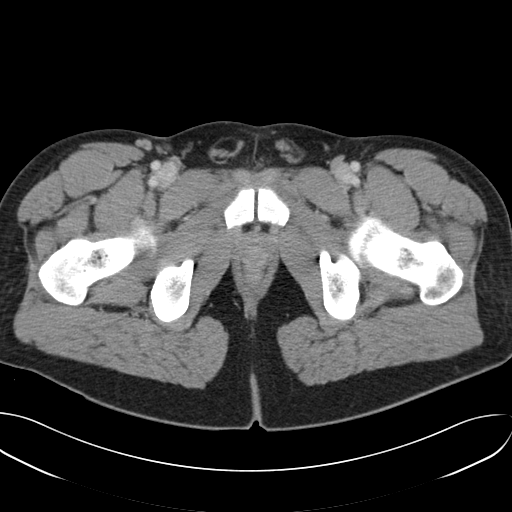
[im 5/103  bone]
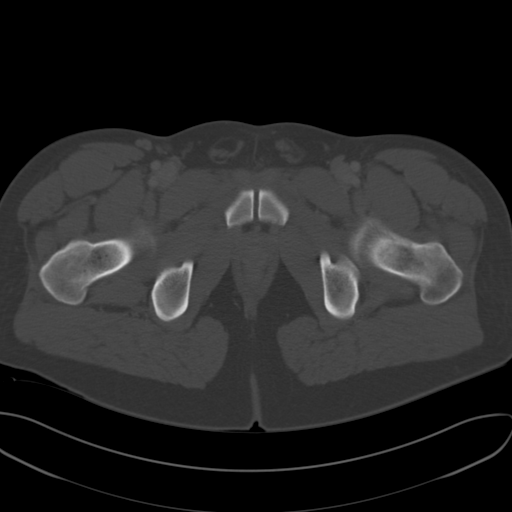
[im 14/103  soft-tissue]
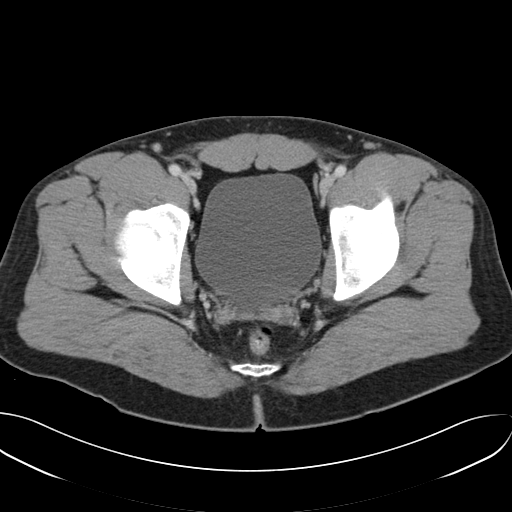
[im 24/103  soft-tissue]
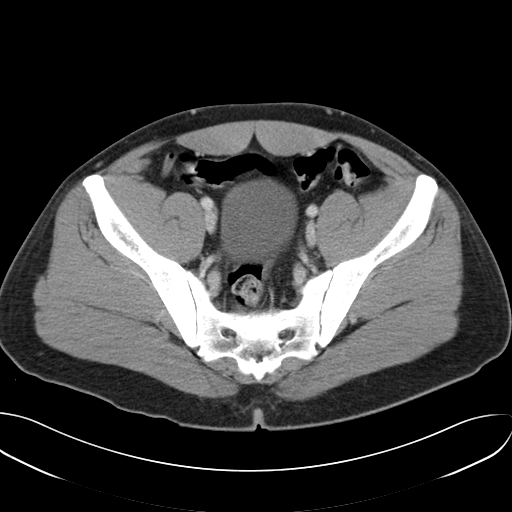
[im 33/103  soft-tissue]
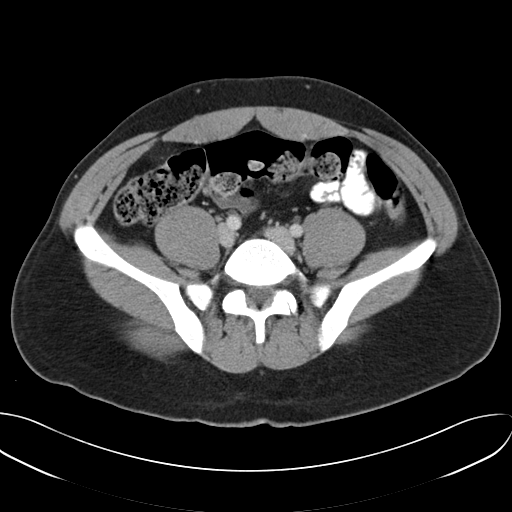
[im 38/103  soft-tissue]
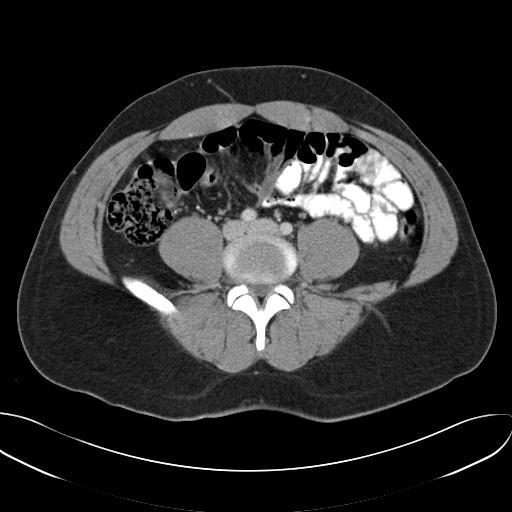
[im 47/103  soft-tissue]
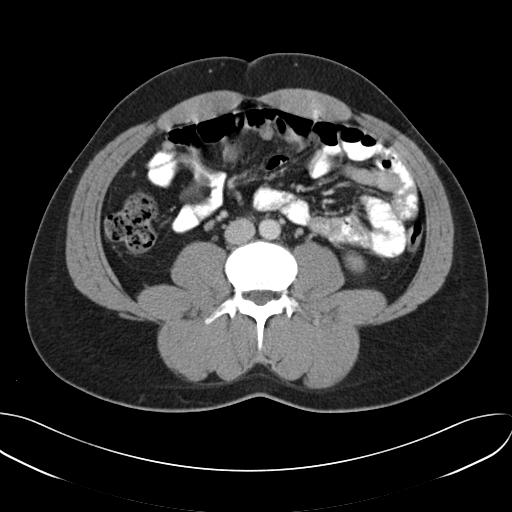
[im 56/103  soft-tissue]
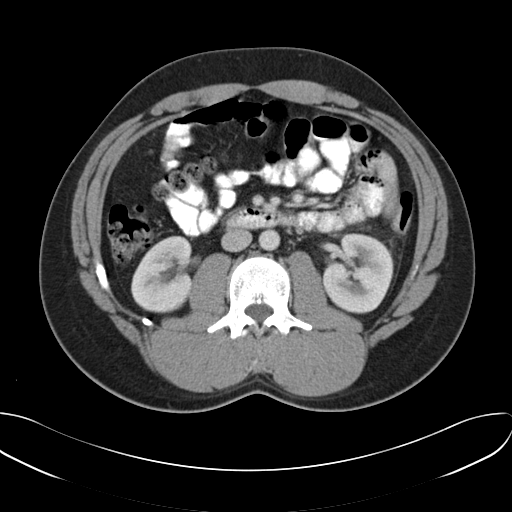
[im 65/103  soft-tissue]
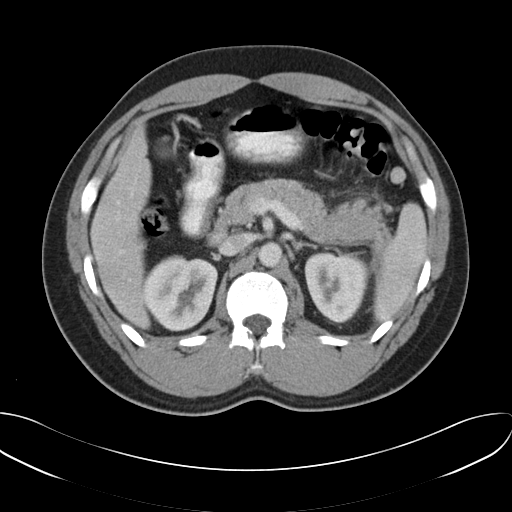
[im 70/103  soft-tissue]
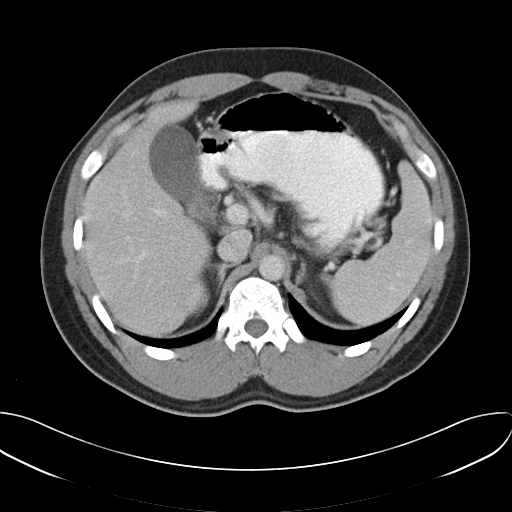
[im 70/103  bone]
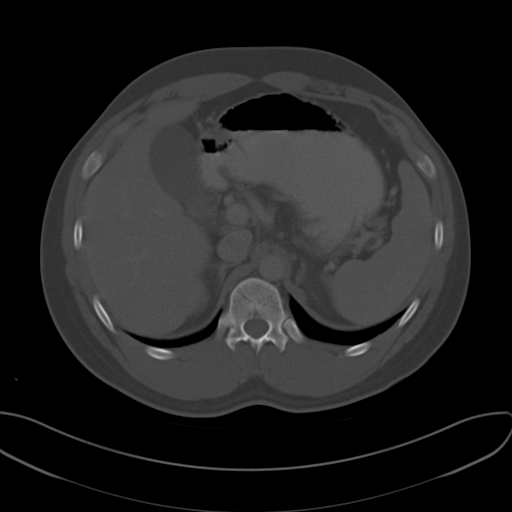
[im 79/103  soft-tissue]
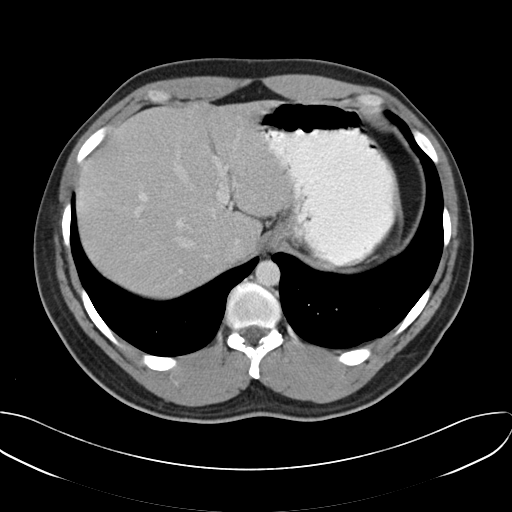
[im 89/103  soft-tissue]
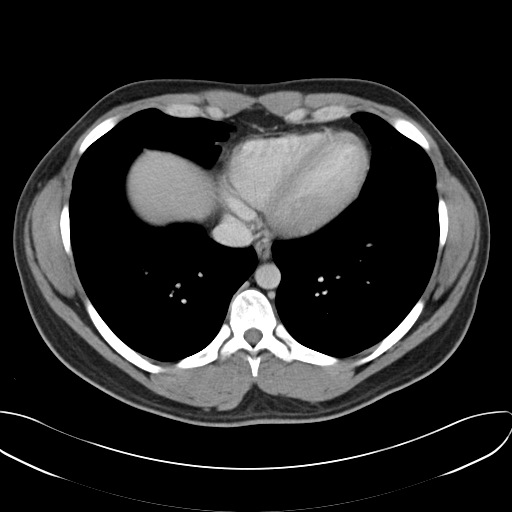
[im 98/103  soft-tissue]
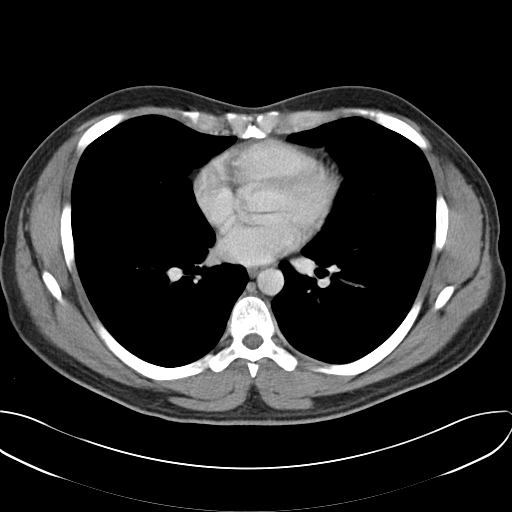

[Series 5: cor routine abd pel with · coronal · 0.74mm/px · 3 of 172 slices shown]
[im 58/172  soft-tissue]
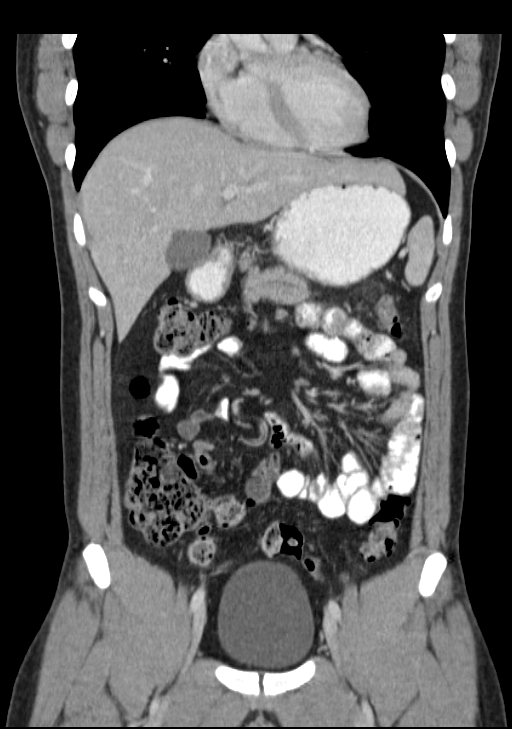
[im 77/172  soft-tissue]
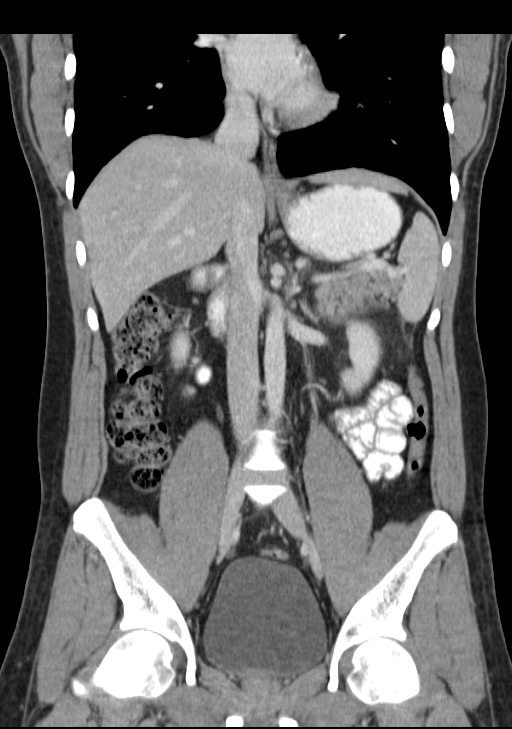
[im 96/172  soft-tissue]
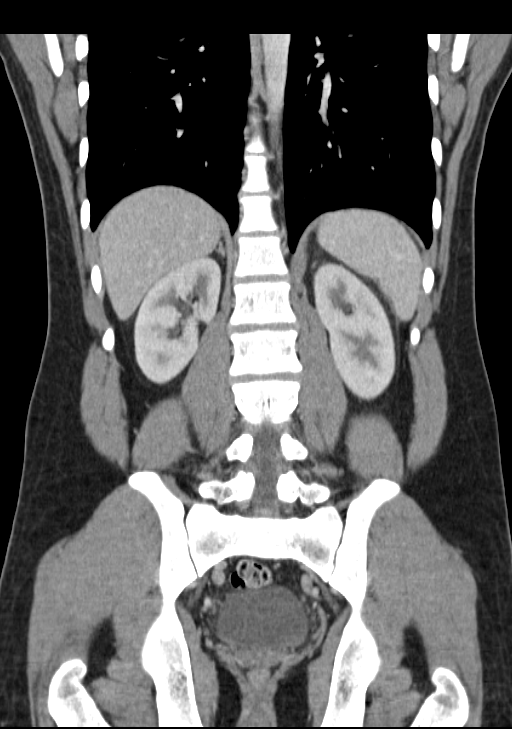

[15 of 46 positions shown; findings below may reference images not displayed]

FINDINGS: There is a enlargement of and edema of the pancreatic tail and
distal body. This is closely applied to the posterior aspect of the
stomach and the splenic hilum. The pancreatic head and proximal body
appear normal. No parenchymal or ductal calcifications are
demonstrated and the duct is not abnormally distended. The liver,
gallbladder, and spleen are unremarkable. There is a 1.5 cm diameter
accessory spleen.

The kidneys enhance well with no evidence of stones or obstruction
or acute inflammation. There are no adrenal masses. The caliber of
the abdominal aorta is normal. The urinary bladder and prostate
gland are unremarkable. There is no inguinal nor umbilical hernia.

The stomach is distended with the oral contrast. Contrast has
traversed the duodenum and approximately [DATE] to [DATE] of the
normal-appearing small bowel. The colon exhibits a normal stool and
gas pattern. There is no evidence of colitis or diverticulitis or
acute appendicitis.

The lung bases are clear. The lumbar spine and bony pelvis will are
unremarkable.
IMPRESSION: 1. Acute pancreatitis involving the distal pancreatic body and tail.
There is no pseudocyst nor significant free fluid. There is
peripancreatic inflammatory change. The liver and gallbladder are
unremarkable.
2. There is no acute stomach or bowel abnormality.
3. There is no acute urinary tract abnormality.

## 2017-06-08 ENCOUNTER — Emergency Department
Admission: EM | Admit: 2017-06-08 | Discharge: 2017-06-08 | Disposition: A | Discharge status: Home / Self Care | Attending: Emergency Medicine | Admitting: Emergency Medicine

## 2017-06-08 DIAGNOSIS — S0083XA Contusion of other part of head, initial encounter: Secondary | ICD-10-CM

## 2017-06-08 DIAGNOSIS — Y939 Activity, unspecified: Secondary | ICD-10-CM | POA: Diagnosis not present

## 2017-06-08 DIAGNOSIS — Y92149 Unspecified place in prison as the place of occurrence of the external cause: Secondary | ICD-10-CM | POA: Diagnosis not present

## 2017-06-08 DIAGNOSIS — S0181XA Laceration without foreign body of other part of head, initial encounter: Secondary | ICD-10-CM | POA: Diagnosis present

## 2017-06-08 DIAGNOSIS — W01190A Fall on same level from slipping, tripping and stumbling with subsequent striking against furniture, initial encounter: Secondary | ICD-10-CM | POA: Insufficient documentation

## 2017-06-08 DIAGNOSIS — Y999 Unspecified external cause status: Secondary | ICD-10-CM | POA: Insufficient documentation

## 2017-06-08 MED ORDER — LIDOCAINE HCL (PF) 1 % IJ SOLN
5.0000 mL | Freq: Once | INTRAMUSCULAR | Status: DC
  Filled 2017-06-08: qty 5

## 2017-06-08 MED ORDER — IBUPROFEN 800 MG PO TABS: 800.0000 mg | ORAL_TABLET | Freq: Three times a day (TID) | ORAL | 0 refills | Status: DC | PRN

## 2017-06-08 MED ORDER — IBUPROFEN 800 MG PO TABS
800.0000 mg | ORAL_TABLET | Freq: Once | ORAL | Status: AC
  Administered 2017-06-08: 800 mg via ORAL
  Filled 2017-06-08: qty 1

## 2017-06-08 MED ORDER — TETANUS-DIPHTH-ACELL PERTUSSIS 5-2.5-18.5 LF-MCG/0.5 IM SUSP
0.5000 mL | Freq: Once | INTRAMUSCULAR | Status: AC
  Administered 2017-06-08: 0.5 mL via INTRAMUSCULAR
  Filled 2017-06-08: qty 0.5

## 2017-06-08 MED ORDER — LIDOCAINE HCL (PF) 1 % IJ SOLN: 5.0000 mL | Freq: Once | INTRAMUSCULAR | Status: DC

## 2017-06-08 MED ORDER — TETANUS-DIPHTH-ACELL PERTUSSIS 5-2.5-18.5 LF-MCG/0.5 IM SUSP: 0.5000 mL | Freq: Once | INTRAMUSCULAR | Status: DC

## 2017-06-08 MED ORDER — IBUPROFEN 800 MG PO TABS: 800.0000 mg | ORAL_TABLET | Freq: Once | ORAL | Status: DC
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
## 2017-06-08 NOTE — ED Notes (Signed)
See downtime charting. 

## 2017-06-08 NOTE — Discharge Instructions (Signed)
Your facial lacerations have been repaired with sutures and wound glue. Keep the wounds clean and dry. Avoid lotions, creams, oils, or ointments over the wound glue. Follow-up with the nurse for suture removal in 3-5 days. Take the ibuprofen as needed.

## 2024-07-13 ENCOUNTER — Encounter: Payer: Self-pay | Admitting: Emergency Medicine

## 2024-07-13 ENCOUNTER — Ambulatory Visit
Admission: EM | Admit: 2024-07-13 | Discharge: 2024-07-13 | Disposition: A | Discharge status: Home / Self Care | Payer: Self-pay | Attending: Emergency Medicine | Admitting: Emergency Medicine

## 2024-07-13 DIAGNOSIS — R1084 Generalized abdominal pain: Secondary | ICD-10-CM

## 2024-07-13 DIAGNOSIS — Z8719 Personal history of other diseases of the digestive system: Secondary | ICD-10-CM

## 2024-07-13 DIAGNOSIS — Z758 Other problems related to medical facilities and other health care: Secondary | ICD-10-CM

## 2024-07-13 MED ORDER — METHYLPREDNISOLONE 4 MG PO TBPK: ORAL_TABLET | ORAL | 0 refills | Status: DC

## 2024-07-13 NOTE — Discharge Instructions (Addendum)
 Take steroids as directed Push fluids Avoid caffeine, nicotine as it makes ulcerative colitis issues worse. Follow up with PCP/ GI-call for appt.-call for appt If you develop bloody stool, bloody vomit, worsening severe abdominal pain, or worsening symptoms, go to the emergency room for further evaluation

## 2024-07-13 NOTE — ED Provider Notes (Signed)
 [facility]    CSN: [id number] Arrival date & time: 07/13/24  1059      History   Chief Complaint Chief Complaint  Patient presents with   Abdominal Pain    HPI Notnamed Scholz is a 38 y.o. male.   Tion Tse, 38 year old male pt, presents to urgent care for evaluation of ulcerative colitis.  Patient states that he got (Remicade) infusions when he was in prison and that he has had ongoing abdominal pain for the last 3 weeks.  Pt denies rectal bleeding,vomiting, or fever. Patient does not have any PCP or GI specialist at present established was telehealth seeing GI at [facility]  The history is provided by the patient. No language interpreter was used.    History reviewed. No pertinent past medical history.  Patient Active Problem List   Diagnosis Date Noted   Does not have primary care provider 07/13/2024   Generalized abdominal pain 07/13/2024   History of ulcerative colitis 07/13/2024    Past Surgical History:  Procedure Laterality Date   HERNIA REPAIR         Home Medications    Prior to Admission medications   Medication Sig Start Date End Date Taking? Authorizing Provider  methylPREDNISolone  (MEDROL  DOSEPAK) 4 MG TBPK tablet Take as directed 07/13/24  Yes Ezabella Teska, NP  aspirin 325 MG tablet Take 325 mg by mouth daily.    [provider]  ibuprofen  (ADVIL ,MOTRIN ) 800 MG tablet Take 1 tablet (800 mg total) by mouth every 8 (eight) hours as needed. 06/08/17   Menshew, Candida LULLA Kings, PA-C    Family History History reviewed. No pertinent family history.  Social History Social History   Tobacco Use   Smoking status: Never   Smokeless tobacco: Current    Types: Chew  Vaping Use   Vaping status: Never Used  Substance Use Topics   Alcohol use: No   Drug use: No     Allergies   Patient has no known allergies.   Review of Systems Review of Systems  Constitutional:  Negative for fever.  Cardiovascular:  Negative for chest pain and  palpitations.  Gastrointestinal:  Positive for abdominal pain and diarrhea. Negative for blood in stool, constipation, nausea and vomiting.       Bloating  All other systems reviewed and are negative.    Physical Exam Triage Vital Signs ED Triage Vitals  Encounter Vitals Group     BP 07/13/24 1112 129/87     Girls Systolic BP Percentile --      Girls Diastolic BP Percentile --      Boys Systolic BP Percentile --      Boys Diastolic BP Percentile --      Pulse Rate 07/13/24 1112 61     Resp 07/13/24 1112 15     Temp 07/13/24 1112 98.3 F (36.8 C)     Temp Source 07/13/24 1112 Oral     SpO2 07/13/24 1112 100 %     Weight 07/13/24 1110 174 lb (78.9 kg)     Height 07/13/24 1110 6' (1.829 m)     Head Circumference --      Peak Flow --      Pain Score 07/13/24 1110 7     Pain Loc --      Pain Education --      Exclude from Growth Chart --    No data found.  Updated Vital Signs BP 129/87 (BP Location: Left Arm)   Pulse  61   Temp 98.3 F (36.8 C) (Oral)   Resp 15   Ht 6' (1.829 m)   Wt 174 lb (78.9 kg)   SpO2 100%   BMI 23.60 kg/m   Visual Acuity Right Eye Distance:   Left Eye Distance:   Bilateral Distance:    Right Eye Near:   Left Eye Near:    Bilateral Near:     Physical Exam Vitals and nursing note reviewed.  Constitutional:      General: He is not in acute distress.    Appearance: He is well-developed.  HENT:     Head: Normocephalic and atraumatic.  Eyes:     Conjunctiva/sclera: Conjunctivae normal.  Cardiovascular:     Rate and Rhythm: Normal rate and regular rhythm.     Heart sounds: No murmur heard. Pulmonary:     Effort: Pulmonary effort is normal. No respiratory distress.     Breath sounds: Normal breath sounds.  Abdominal:     General: Bowel sounds are normal.     Palpations: Abdomen is soft.     Tenderness: There is no abdominal tenderness.  Musculoskeletal:        General: No swelling.     Cervical back: Neck supple.  Skin:     General: Skin is warm and dry.     Capillary Refill: Capillary refill takes less than 2 seconds.  Neurological:     Mental Status: He is alert.  Psychiatric:        Mood and Affect: Mood normal.      UC Treatments / Results  Labs (all labs ordered are listed, but only abnormal results are displayed) Labs Reviewed - No data to display  EKG   Radiology No results found.  Procedures Procedures (including critical care time)  Medications Ordered in UC Medications - No data to display  Initial Impression / Assessment and Plan / UC Course  I have reviewed the triage vital signs and the nursing notes.  Pertinent labs & imaging results that were available during my care of the patient were reviewed by me and considered in my medical decision making (see chart for details).    Discussed exam findings and plan of care with patient, Medrol  dose pack scripted, referral to GI/PCP for follow up, strict go to ER precautions given.   Patient verbalized understanding to this provider.  Ddx: Ulcerative colitis, abdominal pain (generalized), viral illness Final Clinical Impressions(s) / UC Diagnoses   Final diagnoses:  Does not have primary care provider  Generalized abdominal pain  History of ulcerative colitis     Discharge Instructions      Take steroids as directed Push fluids Avoid caffeine, nicotine as it makes ulcerative colitis issues worse. Follow up with PCP/ GI-call for appt.-call for appt If you develop bloody stool, bloody vomit, worsening severe abdominal pain, or worsening symptoms, go to the emergency room for further evaluation     ED Prescriptions     Medication Sig Dispense Auth. Provider   methylPREDNISolone  (MEDROL  DOSEPAK) 4 MG TBPK tablet Take as directed 21 tablet Lessie Manigo, NP      PDMP not reviewed this encounter.   Aminta Loose, NP 07/13/24 1807

## 2024-07-13 NOTE — ED Triage Notes (Signed)
 Patient has ulcerative colitis.  Patient states that he did get infusions while he was in prison.  Patient states that he has had ongoing abdominal pain for 3 weeks.

## 2024-07-17 ENCOUNTER — Encounter: Payer: Self-pay | Admitting: Emergency Medicine

## 2024-07-17 ENCOUNTER — Ambulatory Visit
Admission: EM | Admit: 2024-07-17 | Discharge: 2024-07-17 | Disposition: A | Discharge status: Home / Self Care | Payer: Self-pay | Attending: Emergency Medicine | Admitting: Emergency Medicine

## 2024-07-17 DIAGNOSIS — L03119 Cellulitis of unspecified part of limb: Secondary | ICD-10-CM

## 2024-07-17 MED ORDER — DOXYCYCLINE HYCLATE 100 MG PO CAPS: 100.0000 mg | ORAL_CAPSULE | Freq: Two times a day (BID) | ORAL | 0 refills | Status: DC

## 2024-07-17 MED ORDER — KETOROLAC TROMETHAMINE 30 MG/ML IJ SOLN
30.0000 mg | Freq: Once | INTRAMUSCULAR | Status: AC
  Administered 2024-07-17: 30 mg via INTRAMUSCULAR

## 2024-07-17 NOTE — ED Triage Notes (Signed)
 Pt presents with left hand pain, swelling and redness x 3 days. I had a cut on his pinky and was using cleaning products and the next day his hand started to swell.

## 2024-07-17 NOTE — Discharge Instructions (Addendum)
Take the Doxycycline twice daily with food for 10 days.  Doxycycline will make you more sensitive to sunburn so wear sunscreen when outdoors and reapply it every 90 minutes.  Apply warm compresses to help promote drainage.  Use OTC Tylenol and Ibuprofen according to the package instructions as needed for pain.  Return for new or worsening symptoms.   

## 2024-07-17 NOTE — ED Provider Notes (Signed)
 [facility]    CSN: [id number] Arrival date & time: 07/17/24  1822      History   Chief Complaint Chief Complaint  Patient presents with   Hand Pain    HPI Kagen Kunath is a 38 y.o. male.   HPI  38 year old male with a history of ulcerative colitis presents for evaluation of redness, pain, swelling to his left fifth MCP joint.  He denies any fever or pus drainage.  History reviewed. No pertinent past medical history.  Patient Active Problem List   Diagnosis Date Noted   Does not have primary care provider 07/13/2024   Generalized abdominal pain 07/13/2024   History of ulcerative colitis 07/13/2024    Past Surgical History:  Procedure Laterality Date   HERNIA REPAIR         Home Medications    Prior to Admission medications   Medication Sig Start Date End Date Taking? Authorizing Provider  doxycycline  (VIBRAMYCIN ) 100 MG capsule Take 1 capsule (100 mg total) by mouth 2 (two) times daily. 07/17/24  Yes Bernardino Ditch, NP  aspirin 325 MG tablet Take 325 mg by mouth daily.    [provider]  ibuprofen  (ADVIL ,MOTRIN ) 800 MG tablet Take 1 tablet (800 mg total) by mouth every 8 (eight) hours as needed. 06/08/17   Menshew, Candida LULLA Kings, PA-C  methylPREDNISolone  (MEDROL  DOSEPAK) 4 MG TBPK tablet Take as directed 07/13/24   Defelice, Rilla, NP    Family History No family history on file.  Social History Social History   Tobacco Use   Smoking status: Never   Smokeless tobacco: Current    Types: Chew  Vaping Use   Vaping status: Never Used  Substance Use Topics   Alcohol use: No   Drug use: No     Allergies   Patient has no known allergies.   Review of Systems Review of Systems  Constitutional:  Negative for fever.  Musculoskeletal:  Positive for arthralgias and joint swelling.  Skin:  Positive for color change and wound.     Physical Exam Triage Vital Signs ED Triage Vitals [07/17/24 1837]  Encounter Vitals Group     BP  127/82     Girls Systolic BP Percentile      Girls Diastolic BP Percentile      Boys Systolic BP Percentile      Boys Diastolic BP Percentile      Pulse Rate 76     Resp 16     Temp 98.5 F (36.9 C)     Temp Source Oral     SpO2 98 %     Weight      Height      Head Circumference      Peak Flow      Pain Score 8     Pain Loc      Pain Education      Exclude from Growth Chart    No data found.  Updated Vital Signs BP 127/82 (BP Location: Right Arm)   Pulse 76   Temp 98.5 F (36.9 C) (Oral)   Resp 16   SpO2 98%   Visual Acuity Right Eye Distance:   Left Eye Distance:   Bilateral Distance:    Right Eye Near:   Left Eye Near:    Bilateral Near:     Physical Exam Vitals and nursing note reviewed.  Constitutional:      Appearance: Normal appearance. He is not ill-appearing.  HENT:     Head:  Normocephalic and atraumatic.  Musculoskeletal:        General: Swelling, tenderness and signs of injury present.  Skin:    General: Skin is warm and dry.     Capillary Refill: Capillary refill takes less than 2 seconds.     Findings: Erythema present.  Neurological:     General: No focal deficit present.     Mental Status: He is alert and oriented to person, place, and time.      UC Treatments / Results  Labs (all labs ordered are listed, but only abnormal results are displayed) Labs Reviewed - No data to display  EKG   Radiology No results found.  Procedures Procedures (including critical care time)  Medications Ordered in UC Medications - No data to display  Initial Impression / Assessment and Plan / UC Course  I have reviewed the triage vital signs and the nursing notes.  Pertinent labs & imaging results that were available during my care of the patient were reviewed by me and considered in my medical decision making (see chart for details).   Patient is a nontoxic-appearing 38 year old male presenting for evaluation of pain and swelling to the M CP  joint of the left fifth finger and the surrounding tissue.  As you can see in image above, there is erythema and edema to the MCP joint as well as the proximal phalanx of the fifth finger and the medial aspect of the dorsum of the left hand.  The area of erythema is warm to touch and tender.  No fluctuance or induration.  There is a small laceration over the MCP joint that the patient sustained when he was washing dishes.  He reports that he was cut by a porcelain cup.  2 days later he emptied some dirty mop water from a bucket and some of the mop water got into the cut.  Over the ensuing 2 days the patient has developed increasing redness and pain.  He denies any pus drainage.  He is on immunosuppressants for his ulcerative colitis.  I am concerned he is developing cellulitis.  I will discharge him home on a 10-day course of doxycycline  to cover for potential MRSA.  The patient is also requesting medication for pain so I will have staff administer 30 mg of IM Toradol  here in clinic.   Final Clinical Impressions(s) / UC Diagnoses   Final diagnoses:  Cellulitis of hand     Discharge Instructions      Take the Doxycycline  twice daily with food for 10 days.  Doxycycline  will make you more sensitive to sunburn so wear sunscreen when outdoors and reapply it every 90 minutes.  Apply warm compresses to help promote drainage.  Use OTC Tylenol and Ibuprofen  according to the package instructions as needed for pain.  Return for new or worsening symptoms.       ED Prescriptions     Medication Sig Dispense Auth. Provider   doxycycline  (VIBRAMYCIN ) 100 MG capsule Take 1 capsule (100 mg total) by mouth 2 (two) times daily. 20 capsule Bernardino Ditch, NP      PDMP not reviewed this encounter.   Bernardino Ditch, NP 07/17/24 [phone]

## 2024-07-25 ENCOUNTER — Ambulatory Visit: Payer: Self-pay | Admitting: Family Medicine

## 2024-08-02 ENCOUNTER — Encounter: Payer: Self-pay | Admitting: Family Medicine

## 2024-08-02 ENCOUNTER — Ambulatory Visit: Admitting: Family Medicine

## 2024-08-02 VITALS — BP 124/82 | HR 86 | Ht 72.0 in | Wt 187.4 lb

## 2024-08-02 DIAGNOSIS — Z8719 Personal history of other diseases of the digestive system: Secondary | ICD-10-CM | POA: Diagnosis not present

## 2024-08-02 DIAGNOSIS — Z23 Encounter for immunization: Secondary | ICD-10-CM | POA: Diagnosis not present

## 2024-08-02 DIAGNOSIS — Z13220 Encounter for screening for lipoid disorders: Secondary | ICD-10-CM

## 2024-08-02 DIAGNOSIS — Z131 Encounter for screening for diabetes mellitus: Secondary | ICD-10-CM

## 2024-08-02 DIAGNOSIS — Z136 Encounter for screening for cardiovascular disorders: Secondary | ICD-10-CM

## 2024-08-02 MED ORDER — METHYLPREDNISOLONE 4 MG PO TBPK: ORAL_TABLET | ORAL | 0 refills | Status: AC

## 2024-08-02 NOTE — Progress Notes (Signed)
 New Patient Office Visit  Subjective    Patient ID: Rodney Gross, male    DOB: 03/27/1986  Age: 38 y.o. MRN: [id number]  CC:  Chief Complaint  Patient presents with   Establish Care    Patient recently got out of prison 2 months ago, dx with ulcerative colitis & request referral for GI     Assessment & Plan:   History of ulcerative colitis -     CBC with Differential/Platelet -     Ambulatory referral to Gastroenterology -     methylPREDNISolone ; Take as directed  Dispense: 21 tablet; Refill: 0  Encounter for immunization -     Flu vaccine trivalent PF, 6mos and older(Flulaval,Afluria,Fluarix,Fluzone)  Encounter for hepatitis C screening test for low risk patient  Encounter for lipid screening for cardiovascular disease -     Lipid panel  Diabetes mellitus screening -     Comprehensive metabolic panel with GFR -     Hemoglobin A1c   Assessment and Plan Assessment & Plan Ulcerative colitis Ulcerative colitis flare-up suspected with worsening symptoms post-prednisone. - Refer to Dr. Dorn Finder at [facility] for urgent gastroenterology evaluation and Remicade resumption. - Prescribe prednisone pack for interim symptom management. - Order basic labs: cholesterol, diabetes screening, kidney function, liver function, blood count. - Advise avoidance of spicy foods. - Instruct to visit ER if symptoms worsen, especially severe abdominal pain or blood in stools.    Return in about 3 months (around 11/02/2024) for Annual.   Vinary K Talha Iser, MD   HPI  Discussed the use of AI scribe software for clinical note transcription with the patient, who gave verbal consent to proceed.  History of Present Illness Rodney Gross is a 38 year old male with ulcerative colitis who presents with worsening symptoms after recent release from prison.  He has a history of ulcerative colitis diagnosed over two years ago while in prison. Initially, he experienced rectal bleeding, severe  abdominal pain, bloating, and diarrhea up to eleven to twelve times a day, with significant blood in the stool at its worst. He was treated with Remicade every eight weeks, with the last treatment administered in June 2025.  Since his release from prison, he has experienced a recurrence of symptoms, including painful bowel movements and abdominal pain. Two weeks ago, he sought care at an urgent care facility and was prescribed prednisone, which provided temporary relief. No recent rectal bleeding but significant abdominal pain and difficulty eating. He has not been on mesalamine suppositories and avoids spicy foods.  His social history includes a recent release from a ten-year prison sentence for assault. He does not smoke or drink alcohol. He recently obtained insurance and is in the process of re-establishing care with a gastroenterologist. No mental health issues and has not been evaluated by a psychiatrist while in prison. He was last tested for HIV and hepatitis C last year, with negative results, and does not wish to be retested at this time.   Rodney Gross presents to establish care   Outpatient Encounter Medications as of 08/02/2024  Medication Sig   methylPREDNISolone  (MEDROL  DOSEPAK) 4 MG TBPK tablet Take as directed   [DISCONTINUED] aspirin 325 MG tablet Take 325 mg by mouth daily. (Patient not taking: Reported on 08/02/2024)   [DISCONTINUED] doxycycline  (VIBRAMYCIN ) 100 MG capsule Take 1 capsule (100 mg total) by mouth 2 (two) times daily. (Patient not taking: Reported on 08/02/2024)   [DISCONTINUED] ibuprofen  (ADVIL ,MOTRIN ) 800 MG tablet Take 1 tablet (800  mg total) by mouth every 8 (eight) hours as needed. (Patient not taking: Reported on 08/02/2024)   [DISCONTINUED] methylPREDNISolone  (MEDROL  DOSEPAK) 4 MG TBPK tablet Take as directed   No facility-administered encounter medications on file as of 08/02/2024.      Review of Systems  All other systems reviewed and are negative.        Objective    BP 124/82   Pulse 86   Ht 6' (1.829 m)   Wt 187 lb 6 oz (85 kg)   SpO2 97%   BMI 25.41 kg/m   Physical Exam Vitals and nursing note reviewed.  Constitutional:      Appearance: Normal appearance.  HENT:     Head: Normocephalic.     Right Ear: External ear normal.     Left Ear: External ear normal.  Eyes:     Conjunctiva/sclera: Conjunctivae normal.  Cardiovascular:     Rate and Rhythm: Normal rate.  Pulmonary:     Effort: Pulmonary effort is normal. No respiratory distress.  Abdominal:     Palpations: Abdomen is soft.  Musculoskeletal:        General: Normal range of motion.  Skin:    General: Skin is warm.  Neurological:     Mental Status: He is alert and oriented to person, place, and time.  Psychiatric:        Mood and Affect: Mood normal.

## 2024-08-03 ENCOUNTER — Ambulatory Visit: Payer: Self-pay | Admitting: Family Medicine

## 2024-08-03 LAB — CBC WITH DIFFERENTIAL/PLATELET
Basophils Absolute: 0 x10E3/uL (ref 0.0–0.2)
Basos: 1 %
EOS (ABSOLUTE): 0.1 x10E3/uL (ref 0.0–0.4)
Eos: 3 %
Hematocrit: 44.8 % (ref 37.5–51.0)
Hemoglobin: 14.5 g/dL (ref 13.0–17.7)
Immature Grans (Abs): 0 x10E3/uL (ref 0.0–0.1)
Immature Granulocytes: 0 %
Lymphocytes Absolute: 1.1 x10E3/uL (ref 0.7–3.1)
Lymphs: 28 %
MCH: 29.6 pg (ref 26.6–33.0)
MCHC: 32.4 g/dL (ref 31.5–35.7)
MCV: 91 fL (ref 79–97)
Monocytes Absolute: 0.4 x10E3/uL (ref 0.1–0.9)
Monocytes: 10 %
Neutrophils Absolute: 2.3 x10E3/uL (ref 1.4–7.0)
Neutrophils: 58 %
Platelets: 288 x10E3/uL (ref 150–450)
RBC: 4.9 x10E6/uL (ref 4.14–5.80)
RDW: 12.8 % (ref 11.6–15.4)
WBC: 3.9 x10E3/uL (ref 3.4–10.8)

## 2024-08-03 LAB — LIPID PANEL
Chol/HDL Ratio: 3.9 ratio (ref 0.0–5.0)
Cholesterol, Total: 194 mg/dL (ref 100–199)
HDL: 50 mg/dL (ref >39)
LDL Chol Calc (NIH): 131 mg/dL — ABNORMAL HIGH (ref 0–99)
Triglycerides: 71 mg/dL (ref 0–149)
VLDL Cholesterol Cal: 13 mg/dL (ref 5–40)

## 2024-08-03 LAB — COMPREHENSIVE METABOLIC PANEL WITH GFR
ALT: 18 IU/L (ref 0–44)
AST: 24 IU/L (ref 0–40)
Albumin: 4.4 g/dL (ref 4.1–5.1)
Alkaline Phosphatase: 76 IU/L (ref 47–123)
BUN/Creatinine Ratio: 16 (ref 9–20)
BUN: 15 mg/dL (ref 6–20)
Bilirubin Total: 0.4 mg/dL (ref 0.0–1.2)
CO2: 22 mmol/L (ref 20–29)
Calcium: 9.5 mg/dL (ref 8.7–10.2)
Chloride: 103 mmol/L (ref 96–106)
Creatinine, Ser: 0.96 mg/dL (ref 0.76–1.27)
Globulin, Total: 2.8 g/dL (ref 1.5–4.5)
Glucose: 99 mg/dL (ref 70–99)
Potassium: 5.4 mmol/L — ABNORMAL HIGH (ref 3.5–5.2)
Sodium: 140 mmol/L (ref 134–144)
Total Protein: 7.2 g/dL (ref 6.0–8.5)
eGFR: 103 mL/min/1.73 (ref >59)

## 2024-08-03 LAB — HEMOGLOBIN A1C
Est. average glucose Bld gHb Est-mCnc: 100 mg/dL
Hgb A1c MFr Bld: 5.1 % (ref 4.8–5.6)

## 2024-08-06 ENCOUNTER — Encounter: Payer: Self-pay | Admitting: Family Medicine

## 2024-08-13 ENCOUNTER — Telehealth: Payer: Self-pay | Admitting: Gastroenterology

## 2024-08-13 NOTE — Telephone Encounter (Signed)
 Yes, we also received a referral for patient and he stated he would transfer his care to us  and not continue care with [facility]. Patient stated he will have them fax records to us  to review.

## 2024-08-13 NOTE — Telephone Encounter (Signed)
 Goodmorning Dr.Cirigliano,  DOD AM 08/13/2024  Patient called wanting to schedule due to having a referral sent to our office from his PCP. Patient would like to be seen for history of ulcerative colitis. Patient was last seen at [facility] and would like to transfer care to us  due to being out of prison. Patient was only seen by [facility] virtually. Patient does not have a specific provider he would like to be scheduled with.Records cannot be found in epic patient will have them fax records to us . Please advise for future scheduling  Thank you

## 2024-11-08 ENCOUNTER — Encounter: Admitting: Family Medicine

## 2024-11-08 ENCOUNTER — Encounter: Payer: Self-pay | Admitting: Family Medicine

## 2024-12-04 ENCOUNTER — Encounter (HOSPITAL_BASED_OUTPATIENT_CLINIC_OR_DEPARTMENT_OTHER): Payer: Self-pay | Admitting: Emergency Medicine

## 2024-12-04 ENCOUNTER — Other Ambulatory Visit: Payer: Self-pay

## 2024-12-04 ENCOUNTER — Emergency Department (HOSPITAL_BASED_OUTPATIENT_CLINIC_OR_DEPARTMENT_OTHER): Admission: EM | Admit: 2024-12-04 | Discharge: 2024-12-05 | Disposition: A

## 2024-12-04 DIAGNOSIS — M109 Gout, unspecified: Secondary | ICD-10-CM | POA: Insufficient documentation

## 2024-12-04 HISTORY — DX: Ulcerative colitis, unspecified, without complications: K51.90

## 2024-12-04 LAB — COMPREHENSIVE METABOLIC PANEL WITH GFR
ALT: 22 U/L (ref 0–44)
AST: 26 U/L (ref 15–41)
Albumin: 4.8 g/dL (ref 3.5–5.0)
Alkaline Phosphatase: 69 U/L (ref 38–126)
Anion gap: 9 (ref 5–15)
BUN: 15 mg/dL (ref 6–20)
CO2: 29 mmol/L (ref 22–32)
Calcium: 9.6 mg/dL (ref 8.9–10.3)
Chloride: 104 mmol/L (ref 98–111)
Creatinine, Ser: 1.01 mg/dL (ref 0.61–1.24)
GFR, Estimated: >60 mL/min
Glucose, Bld: 94 mg/dL (ref 70–99)
Potassium: 4.1 mmol/L (ref 3.5–5.1)
Sodium: 142 mmol/L (ref 135–145)
Total Bilirubin: 0.4 mg/dL (ref 0.0–1.2)
Total Protein: 7.7 g/dL (ref 6.5–8.1)

## 2024-12-04 LAB — CBC
HCT: 43.3 % (ref 39.0–52.0)
Hemoglobin: 15.2 g/dL (ref 13.0–17.0)
MCH: 29.5 pg (ref 26.0–34.0)
MCHC: 35.1 g/dL (ref 30.0–36.0)
MCV: 84.1 fL (ref 80.0–100.0)
Platelets: 285 10*3/uL (ref 150–400)
RBC: 5.15 MIL/uL (ref 4.22–5.81)
RDW: 12.7 % (ref 11.5–15.5)
WBC: 6.1 10*3/uL (ref 4.0–10.5)
nRBC: 0 % (ref 0.0–0.2)

## 2024-12-04 LAB — URIC ACID: Uric Acid, Serum: 4.2 mg/dL (ref 3.7–8.6)

## 2024-12-04 MED ORDER — KETOROLAC TROMETHAMINE 15 MG/ML IJ SOLN
15.0000 mg | Freq: Once | INTRAMUSCULAR | Status: AC
  Administered 2024-12-04: 15 mg via INTRAMUSCULAR
  Filled 2024-12-04: qty 1

## 2024-12-04 NOTE — ED Triage Notes (Signed)
 Pt c/o BL toe pain and swelling, bruising (3 on L foot, 4 on R foot). Also reports numbness. X 5 days. Denies known injury, diabetes.

## 2024-12-05 ENCOUNTER — Other Ambulatory Visit (HOSPITAL_BASED_OUTPATIENT_CLINIC_OR_DEPARTMENT_OTHER): Payer: Self-pay

## 2024-12-05 MED ORDER — INDOMETHACIN 50 MG PO CAPS
50.0000 mg | ORAL_CAPSULE | Freq: Three times a day (TID) | ORAL | 0 refills | Status: AC
  Filled 2024-12-05: qty 21, 7d supply, fill #0

## 2024-12-05 NOTE — Discharge Instructions (Addendum)
 Please take indomethacin  for his symptoms.  Can take Tylenol with this.  Follow-up with your primary care doctor for further evaluation.  Return for worsening pain, redness, swelling, fevers.
